# Patient Record
Sex: Female | Born: 1995 | Race: White | Hispanic: No | Marital: Single | Smoking: Never smoker
Health system: Southern US, Community
[De-identification: ages and names within clinical notes are randomized; demographics above are authoritative.]

## PROBLEM LIST (undated history)

## (undated) HISTORY — PX: WISDOM TOOTH EXTRACTION: SHX21

---

## 2019-10-22 ENCOUNTER — Encounter (HOSPITAL_COMMUNITY): Payer: Self-pay

## 2019-10-22 ENCOUNTER — Other Ambulatory Visit: Payer: Self-pay

## 2019-10-22 DIAGNOSIS — O9A211 Injury, poisoning and certain other consequences of external causes complicating pregnancy, first trimester: Secondary | ICD-10-CM | POA: Insufficient documentation

## 2019-10-22 DIAGNOSIS — Y929 Unspecified place or not applicable: Secondary | ICD-10-CM | POA: Insufficient documentation

## 2019-10-22 DIAGNOSIS — O210 Mild hyperemesis gravidarum: Secondary | ICD-10-CM | POA: Insufficient documentation

## 2019-10-22 DIAGNOSIS — Z3A01 Less than 8 weeks gestation of pregnancy: Secondary | ICD-10-CM | POA: Insufficient documentation

## 2019-10-22 DIAGNOSIS — S93401A Sprain of unspecified ligament of right ankle, initial encounter: Secondary | ICD-10-CM | POA: Insufficient documentation

## 2019-10-22 DIAGNOSIS — X58XXXA Exposure to other specified factors, initial encounter: Secondary | ICD-10-CM | POA: Insufficient documentation

## 2019-10-22 DIAGNOSIS — R55 Syncope and collapse: Secondary | ICD-10-CM | POA: Insufficient documentation

## 2019-10-22 DIAGNOSIS — Y999 Unspecified external cause status: Secondary | ICD-10-CM | POA: Insufficient documentation

## 2019-10-22 DIAGNOSIS — S0990XA Unspecified injury of head, initial encounter: Secondary | ICD-10-CM | POA: Insufficient documentation

## 2019-10-22 DIAGNOSIS — O26891 Other specified pregnancy related conditions, first trimester: Secondary | ICD-10-CM | POA: Insufficient documentation

## 2019-10-22 DIAGNOSIS — Y939 Activity, unspecified: Secondary | ICD-10-CM | POA: Insufficient documentation

## 2019-10-22 LAB — BASIC METABOLIC PANEL
Anion gap: 9 (ref 5–15)
BUN: 7 mg/dL (ref 6–20)
CO2: 23 mmol/L (ref 22–32)
Calcium: 9.6 mg/dL (ref 8.9–10.3)
Chloride: 105 mmol/L (ref 98–111)
Creatinine, Ser: 0.53 mg/dL (ref 0.44–1.00)
GFR calc Af Amer: >60 mL/min (ref >60)
GFR calc non Af Amer: >60 mL/min (ref >60)
Glucose, Bld: 105 mg/dL — ABNORMAL HIGH (ref 70–99)
Potassium: 3.8 mmol/L (ref 3.5–5.1)
Sodium: 137 mmol/L (ref 135–145)

## 2019-10-22 LAB — I-STAT BETA HCG BLOOD, ED (MC, WL, AP ONLY): I-stat hCG, quantitative: >2000 m[IU]/mL — ABNORMAL HIGH (ref <5)

## 2019-10-22 LAB — CBG MONITORING, ED: Glucose-Capillary: 103 mg/dL — ABNORMAL HIGH (ref 70–99)

## 2019-10-22 LAB — URINALYSIS, ROUTINE W REFLEX MICROSCOPIC
Bilirubin Urine: NEGATIVE
Glucose, UA: NEGATIVE mg/dL
Hgb urine dipstick: NEGATIVE
Ketones, ur: NEGATIVE mg/dL
Leukocytes,Ua: NEGATIVE
Nitrite: NEGATIVE
Protein, ur: NEGATIVE mg/dL
Specific Gravity, Urine: 1.003 — ABNORMAL LOW (ref 1.005–1.030)
pH: 7 (ref 5.0–8.0)

## 2019-10-22 LAB — CBC
HCT: 41.1 % (ref 36.0–46.0)
Hemoglobin: 13.5 g/dL (ref 12.0–15.0)
MCH: 28.5 pg (ref 26.0–34.0)
MCHC: 32.8 g/dL (ref 30.0–36.0)
MCV: 86.9 fL (ref 80.0–100.0)
Platelets: 355 10*3/uL (ref 150–400)
RBC: 4.73 MIL/uL (ref 3.87–5.11)
RDW: 14.3 % (ref 11.5–15.5)
WBC: 12.7 10*3/uL — ABNORMAL HIGH (ref 4.0–10.5)
nRBC: 0 % (ref 0.0–0.2)

## 2019-10-22 NOTE — ED Triage Notes (Signed)
Pt arrives POV from home with complaints of a syncope today. Pt endorses head trauma. Pt reports that she is [redacted] weeks pregnant. Pt denies any PMH.

## 2019-10-23 ENCOUNTER — Encounter (HOSPITAL_COMMUNITY): Payer: Self-pay | Admitting: Emergency Medicine

## 2019-10-23 ENCOUNTER — Emergency Department (HOSPITAL_COMMUNITY): Payer: Self-pay

## 2019-10-23 ENCOUNTER — Emergency Department (HOSPITAL_COMMUNITY)
Admission: EM | Admit: 2019-10-23 | Discharge: 2019-10-23 | Disposition: A | Discharge status: Home / Self Care | Payer: Self-pay | Attending: Emergency Medicine | Admitting: Emergency Medicine

## 2019-10-23 DIAGNOSIS — S93401A Sprain of unspecified ligament of right ankle, initial encounter: Secondary | ICD-10-CM

## 2019-10-23 DIAGNOSIS — O210 Mild hyperemesis gravidarum: Secondary | ICD-10-CM

## 2019-10-23 DIAGNOSIS — S0990XA Unspecified injury of head, initial encounter: Secondary | ICD-10-CM

## 2019-10-23 DIAGNOSIS — R55 Syncope and collapse: Secondary | ICD-10-CM

## 2019-10-23 MED ORDER — METOCLOPRAMIDE HCL 10 MG PO TABS
10.0000 mg | ORAL_TABLET | Freq: Four times a day (QID) | ORAL | 0 refills | Status: DC | PRN
Start: 2019-10-23 — End: 2020-03-21

## 2019-10-23 NOTE — ED Provider Notes (Addendum)
[facility] Provider Note: Monique Dell, MD, FACEP  CSN: [id number] MRN: [id number] ARRIVAL: 10/22/19 at 1722 ROOM: WTR9/WTR9   CHIEF COMPLAINT  Syncope   HISTORY OF PRESENT ILLNESS  10/23/19 3:32 AM Alaney Witter is a 24 y.o. female who is about [redacted] weeks pregnant.  She sprained her right ankle about 3 weeks ago.  She was wearing a friend's brace on it but no longer has that.  She has had about a week of fairly persistent nausea and vomiting which she attributes to morning sickness.  Yesterday afternoon about 4 PM while she was examining her ankle she had a syncopal episode.  She fell and hit the back of her head.  She has a mild headache in her occipital region which she rates as a 2 out of 10.  She denies injury elsewhere.  She is not short of breath.   History reviewed. No pertinent past medical history.  Past Surgical History:  Procedure Laterality Date  . WISDOM TOOTH EXTRACTION      No family history on file.  Social History   Tobacco Use  . Smoking status: Never Smoker  . Smokeless tobacco: Never Used  Vaping Use  . Vaping Use: Every day  Substance Use Topics  . Alcohol use: Yes    Comment: occ  . Drug use: Yes    Types: Marijuana    Prior to Admission medications   Medication Sig Start Date End Date Taking? Authorizing Provider  metoCLOPramide (REGLAN) 10 MG tablet Take 1 tablet (10 mg total) by mouth every 6 (six) hours as needed for nausea or vomiting. 10/23/19   Swain Acree, Jonny Ruiz, MD    Allergies Patient has no known allergies.   REVIEW OF SYSTEMS  Negative except as noted here or in the History of Present Illness.   PHYSICAL EXAMINATION  Initial Vital Signs Blood pressure (!) 135/91, pulse 82, temperature 98.3 F (36.8 C), temperature source Oral, resp. rate 15, height 5\' 2"  (1.575 m), weight 83.3 kg, SpO2 99 %.  Examination General: Well-developed, well-nourished female in no acute distress; appearance consistent with age of record HENT:  normocephalic; mild tenderness at base of skull (occiput) Eyes: pupils equal, round and reactive to light; extraocular muscles intact Neck: supple; nontender Heart: regular rate and rhythm Lungs: clear to auscultation bilaterally Abdomen: soft; nondistended; nontender; bowel sounds present Extremities: No deformity; pulses normal; ecchymosis and swelling over right lateral malleolus Neurologic: Awake, alert and oriented; motor function intact in all extremities and symmetric; no facial droop Skin: Warm and dry Psychiatric: Normal mood and affect   RESULTS  Summary of this visit's results, reviewed and interpreted by myself:   EKG Interpretation  Date/Time:  Sunday October 22 2019 22:06:03 EDT Ventricular Rate:  72 PR Interval:  128 QRS Duration: 94 QT Interval:  386 QTC Calculation: 422 R Axis:   87 Text Interpretation: Sinus rhythm with marked sinus arrhythmia Otherwise normal ECG No previous ECGs available Confirmed by Zedekiah Hinderman ([id number]) on 10/23/2019 3:29:07 AM      Laboratory Studies: Results for orders placed or performed during the hospital encounter of 10/23/19 (from the past 24 hour(s))  CBG monitoring, ED     Status: Abnormal   Collection Time: 10/22/19  5:42 PM  Result Value Ref Range   Glucose-Capillary 103 (H) 70 - 99 mg/dL  Basic metabolic panel     Status: Abnormal   Collection Time: 10/22/19  7:02 PM  Result Value Ref Range   Sodium 137 135 - 145  mmol/L   Potassium 3.8 3.5 - 5.1 mmol/L   Chloride 105 98 - 111 mmol/L   CO2 23 22 - 32 mmol/L   Glucose, Bld 105 (H) 70 - 99 mg/dL   BUN 7 6 - 20 mg/dL   Creatinine, Ser 2.09 0.44 - 1.00 mg/dL   Calcium 9.6 8.9 - 47.0 mg/dL   GFR calc non Af Amer >60 >60 mL/min   GFR calc Af Amer >60 >60 mL/min   Anion gap 9 5 - 15  CBC     Status: Abnormal   Collection Time: 10/22/19  7:02 PM  Result Value Ref Range   WBC 12.7 (H) 4.0 - 10.5 K/uL   RBC 4.73 3.87 - 5.11 MIL/uL   Hemoglobin 13.5 12.0 - 15.0 g/dL   HCT 96.2  36 - 46 %   MCV 86.9 80.0 - 100.0 fL   MCH 28.5 26.0 - 34.0 pg   MCHC 32.8 30.0 - 36.0 g/dL   RDW 83.6 62.9 - 47.6 %   Platelets 355 150 - 400 K/uL   nRBC 0.0 0.0 - 0.2 %  I-Stat beta hCG blood, ED     Status: Abnormal   Collection Time: 10/22/19  7:07 PM  Result Value Ref Range   I-stat hCG, quantitative >2,000.0 (H) <5 mIU/mL   Comment 3          Urinalysis, Routine w reflex microscopic Urine, Clean Catch     Status: Abnormal   Collection Time: 10/22/19  7:33 PM  Result Value Ref Range   Color, Urine STRAW (A) YELLOW   APPearance CLEAR CLEAR   Specific Gravity, Urine 1.003 (L) 1.005 - 1.030   pH 7.0 5.0 - 8.0   Glucose, UA NEGATIVE NEGATIVE mg/dL   Hgb urine dipstick NEGATIVE NEGATIVE   Bilirubin Urine NEGATIVE NEGATIVE   Ketones, ur NEGATIVE NEGATIVE mg/dL   Protein, ur NEGATIVE NEGATIVE mg/dL   Nitrite NEGATIVE NEGATIVE   Leukocytes,Ua NEGATIVE NEGATIVE   Imaging Studies: No results found.  ED COURSE and MDM  Nursing notes, initial and subsequent vitals signs, including pulse oximetry, reviewed and interpreted by myself.  Vitals:   10/22/19 1738 10/22/19 1739 10/22/19 2210 10/23/19 0318  BP: 135/88  134/84 (!) 135/91  Pulse: 83  63 82  Resp: 20  18 15   Temp: 98.4 F (36.9 C)   98.3 F (36.8 C)  TempSrc: Oral   Oral  SpO2: 99%  95% 99%  Weight:  83.3 kg    Height:  5\' 2"  (1.575 m)     Medications - No data to display  Patient does not appear clinically dehydrated.  The syncopal episode may have been related to her morning sickness.  The ankle sprain appears to be healing but we will place it in an ASO to help facilitate further healing.  We will provide a prescription for Reglan until she can follow-up with her OB/GYN regarding longer term treatment of her morning sickness.  PROCEDURES  Procedures   ED DIAGNOSES     ICD-10-CM   1. Syncope, unspecified syncope type  R55   2. Sprain of right ankle, unspecified ligament, initial encounter  S93.401A   3.  Morning sickness  O21.0   4. Minor head injury, initial encounter  S09.90XA        , MD 10/23/19 0341    Paula Libra, MD 10/23/19 [phone]

## 2020-03-09 ENCOUNTER — Ambulatory Visit (HOSPITAL_COMMUNITY)
Admission: EM | Admit: 2020-03-09 | Discharge: 2020-03-09 | Disposition: A | Discharge status: Home / Self Care | Payer: BC Managed Care – PPO | Attending: Family Medicine | Admitting: Family Medicine

## 2020-03-09 ENCOUNTER — Other Ambulatory Visit: Payer: Self-pay

## 2020-03-09 ENCOUNTER — Encounter (HOSPITAL_COMMUNITY): Payer: Self-pay | Admitting: Emergency Medicine

## 2020-03-09 ENCOUNTER — Telehealth (HOSPITAL_COMMUNITY): Payer: Self-pay | Admitting: Emergency Medicine

## 2020-03-09 DIAGNOSIS — L255 Unspecified contact dermatitis due to plants, except food: Secondary | ICD-10-CM

## 2020-03-09 MED ORDER — AMOXICILLIN-POT CLAVULANATE 875-125 MG PO TABS
1.0000 | ORAL_TABLET | Freq: Two times a day (BID) | ORAL | 0 refills | Status: DC
Start: 2020-03-09 — End: 2020-03-21

## 2020-03-09 MED ORDER — PREDNISONE 10 MG (48) PO TBPK
ORAL_TABLET | ORAL | 0 refills | Status: AC
Start: 2020-03-09 — End: ?

## 2020-03-09 MED ORDER — AMOXICILLIN-POT CLAVULANATE 875-125 MG PO TABS
1.0000 | ORAL_TABLET | Freq: Two times a day (BID) | ORAL | 0 refills | Status: DC
Start: 2020-03-09 — End: 2020-03-09

## 2020-03-09 MED ORDER — PREDNISONE 10 MG (48) PO TBPK
ORAL_TABLET | ORAL | 0 refills | Status: DC
Start: 2020-03-09 — End: 2020-03-09

## 2020-03-09 NOTE — ED Provider Notes (Signed)
  [facility]   [id number] 03/09/20 Arrival Time: 1024  ASSESSMENT & PLAN:  1. Rhus dermatitis   Likely infected.  Begin: Meds ordered this encounter  Medications  . amoxicillin-clavulanate (AUGMENTIN) 875-125 MG tablet    Sig: Take 1 tablet by mouth every 12 (twelve) hours.    Dispense:  14 tablet    Refill:  0  . predniSONE (STERAPRED UNI-PAK 48 TAB) 10 MG (48) TBPK tablet    Sig: Take as directed.    Dispense:  48 tablet    Refill:  0    Will follow up with PCP or here if worsening or failing to improve as anticipated. Reviewed expectations re: course of current medical issues. Questions answered. Outlined signs and symptoms indicating need for more acute intervention. Patient verbalized understanding. After Visit Summary given.   SUBJECTIVE:  Monique Vaughan is a 25 y.o. female who presents with a skin complaint. Itchy rash; bilateral arms with small spot on face; present this week; clear/yellow drainage from blistered areas.   Patient's last menstrual period was 03/02/2020.  OBJECTIVE: Vitals:   03/09/20 1148  BP: 122/70  Pulse: (!) 58  Resp: 18  Temp: (!) 97.5 F (36.4 C)  TempSrc: Oral  SpO2: 100%    General appearance: alert; no distress HEENT: [place]; AT Neck: supple with FROM Lungs: clear to auscultation bilaterally Heart: regular rate and rhythm Extremities: no edema; moves all extremities normally Skin: warm and dry; clusters of papules and vesicles with surrounding erythema over distal arms with small spot over L jaw; overlying yellowish crusting Psychological: alert and cooperative; normal mood and affect  No Known Allergies  History reviewed. No pertinent past medical history. Social History   Socioeconomic History  . Marital status: Single    Spouse name: Not on file  . Number of children: Not on file  . Years of education: Not on file  . Highest education level: Not on file  Occupational History  . Not on file  Tobacco Use  .  Smoking status: Never Smoker  . Smokeless tobacco: Never Used  Vaping Use  . Vaping Use: Every day  Substance and Sexual Activity  . Alcohol use: Yes    Comment: occ  . Drug use: Yes    Types: Marijuana  . Sexual activity: Yes    Birth control/protection: Pill  Other Topics Concern  . Not on file  Social History Narrative  . Not on file   Social Determinants of Health   Financial Resource Strain: Not on file  Food Insecurity: Not on file  Transportation Needs: Not on file  Physical Activity: Not on file  Stress: Not on file  Social Connections: Not on file  Intimate Partner Violence: Not on file   No family history on file. Past Surgical History:  Procedure Laterality Date  . WISDOM TOOTH EXTRACTION       Mardella Layman, MD 03/09/20 1236

## 2020-03-09 NOTE — ED Triage Notes (Signed)
Clusters of blisters on extremities and face.  Arms are particularly bad.  Areas itched more initially, than now.  Has been using otc creams.  Blisters/sores are oozing clear liquid

## 2020-03-21 ENCOUNTER — Other Ambulatory Visit: Payer: Self-pay

## 2020-03-21 ENCOUNTER — Ambulatory Visit (HOSPITAL_COMMUNITY)
Admission: EM | Admit: 2020-03-21 | Discharge: 2020-03-21 | Disposition: A | Discharge status: Home / Self Care | Payer: BC Managed Care – PPO | Attending: Family Medicine | Admitting: Family Medicine

## 2020-03-21 ENCOUNTER — Encounter (HOSPITAL_COMMUNITY): Payer: Self-pay

## 2020-03-21 DIAGNOSIS — T7840XA Allergy, unspecified, initial encounter: Secondary | ICD-10-CM

## 2020-03-21 DIAGNOSIS — Z880 Allergy status to penicillin: Secondary | ICD-10-CM

## 2020-03-21 MED ORDER — CETIRIZINE HCL 10 MG PO TABS: 10.0000 mg | ORAL_TABLET | Freq: Every day | ORAL | 0 refills | Status: AC

## 2020-03-21 NOTE — ED Provider Notes (Signed)
[facility]    CSN: [id number] Arrival date & time: 03/21/20  1058      History   Chief Complaint Chief Complaint  Patient presents with  . Allergic Reaction    HPI Monique Vaughan is a 25 y.o. female.   HPI  Patient states that she was seen several days ago for poison ivy. She was broken out all of her arms. There was 1 area that look infected. She was treated with prednisone and Augmentin. She took all of the Augmentin and then broke out in a rash that is "all over". Itches mildly. She has never been allergic to medicines in the past. She has taken penicillin/amoxicillin in the past without trouble. Not for many years History reviewed. No pertinent past medical history.  There are no problems to display for this patient.   Past Surgical History:  Procedure Laterality Date  . WISDOM TOOTH EXTRACTION      OB History    Gravida  1   Para      Term      Preterm      AB      Living        SAB      IAB      Ectopic      Multiple      Live Births               Home Medications    Prior to Admission medications   Medication Sig Start Date End Date Taking? Authorizing Provider  cetirizine (ZYRTEC) 10 MG tablet Take 1 tablet (10 mg total) by mouth daily. 03/21/20  Yes Eustace Moore, MD  norgestimate-ethinyl estradiol (ORTHO-CYCLEN) 0.25-35 MG-MCG tablet Take 1 tablet by mouth daily.    [provider]  predniSONE (STERAPRED UNI-PAK 48 TAB) 10 MG (48) TBPK tablet Take as directed. 03/09/20   Mardella Layman, MD  metoCLOPramide (REGLAN) 10 MG tablet Take 1 tablet (10 mg total) by mouth every 6 (six) hours as needed for nausea or vomiting. 10/23/19 03/21/20  Molpus, Jonny Ruiz, MD    Family History History reviewed. No pertinent family history.  Social History Social History   Tobacco Use  . Smoking status: Never Smoker  . Smokeless tobacco: Never Used  Vaping Use  . Vaping Use: Every day  Substance Use Topics  . Alcohol use: Yes     Comment: occ  . Drug use: Yes    Types: Marijuana     Allergies   Augmentin [amoxicillin-pot clavulanate]   Review of Systems Review of Systems See HPI  Physical Exam Triage Vital Signs ED Triage Vitals  Enc Vitals Group     BP 03/21/20 1145 125/67     Pulse Rate 03/21/20 1145 79     Resp 03/21/20 1145 18     Temp 03/21/20 1145 98.5 F (36.9 C)     Temp Source 03/21/20 1145 Oral     SpO2 03/21/20 1145 100 %     Weight --      Height --      Head Circumference --      Peak Flow --      Pain Score 03/21/20 1143 0     Pain Loc --      Pain Edu? --      Excl. in GC? --    No data found.  Updated Vital Signs BP 125/67 (BP Location: Right Arm)   Pulse 79   Temp 98.5 F (36.9 C) (Oral)  Resp 18   LMP 03/02/2020   SpO2 100%   Breastfeeding Unknown     Physical Exam Constitutional:      General: She is not in acute distress.    Appearance: She is well-developed and well-nourished.  HENT:     Head: Normocephalic and atraumatic.     Mouth/Throat:     Mouth: Oropharynx is clear and moist.     Comments: Mask is in place Eyes:     Conjunctiva/sclera: Conjunctivae normal.     Pupils: Pupils are equal, round, and reactive to light.  Cardiovascular:     Rate and Rhythm: Normal rate.  Pulmonary:     Effort: Pulmonary effort is normal. No respiratory distress.     Breath sounds: Normal breath sounds.  Abdominal:     General: There is no distension.     Palpations: Abdomen is soft.  Musculoskeletal:        General: No edema. Normal range of motion.     Cervical back: Normal range of motion and neck supple.  Lymphadenopathy:     Cervical: No cervical adenopathy.  Skin:    General: Skin is warm and dry.     Findings: Rash present.     Comments: Fine morbilliform rash is seen on chest back and both upper extremities diffusely  Neurological:     Mental Status: She is alert.  Psychiatric:        Behavior: Behavior normal.      UC Treatments / Results   Labs (all labs ordered are listed, but only abnormal results are displayed) Labs Reviewed - No data to display  EKG   Radiology No results found.  Procedures Procedures (including critical care time)  Medications Ordered in UC Medications - No data to display  Initial Impression / Assessment and Plan / UC Course  I have reviewed the triage vital signs and the nursing notes.  Pertinent labs & imaging results that were available during my care of the patient were reviewed by me and considered in my medical decision making (see chart for details).     Encourage patient to finish her prednisone. I believe this is a penicillin rash. Cautioned against taking penicillins in the future unless it is an emergency. We will treat with antihistamines since she is already on prednisone  Follow-up as needed Final Clinical Impressions(s) / UC Diagnoses   Final diagnoses:  Penicillin allergy  Allergic reaction to drug, initial encounter     Discharge Instructions     Finish the prednisone Take zyrtec once a day Return as needed   ED Prescriptions    Medication Sig Dispense Auth. Provider   cetirizine (ZYRTEC) 10 MG tablet Take 1 tablet (10 mg total) by mouth daily. 30 tablet Eustace Moore, MD     PDMP not reviewed this encounter.   Eustace Moore, MD 03/21/20 1228

## 2020-03-21 NOTE — ED Triage Notes (Signed)
Pt presents with rash on different areas of body since yesterday that she states is slightly itchy.

## 2020-03-21 NOTE — Discharge Instructions (Addendum)
Finish the prednisone Take zyrtec once a day Return as needed

## 2020-06-26 ENCOUNTER — Other Ambulatory Visit: Payer: Self-pay

## 2020-06-26 ENCOUNTER — Encounter (HOSPITAL_COMMUNITY): Payer: Self-pay

## 2020-06-26 ENCOUNTER — Ambulatory Visit (INDEPENDENT_AMBULATORY_CARE_PROVIDER_SITE_OTHER): Payer: BC Managed Care – PPO

## 2020-06-26 ENCOUNTER — Ambulatory Visit (HOSPITAL_COMMUNITY)
Admission: RE | Admit: 2020-06-26 | Discharge: 2020-06-26 | Disposition: A | Discharge status: Home / Self Care | Payer: BC Managed Care – PPO | Source: Ambulatory Visit | Attending: Urgent Care | Admitting: Urgent Care

## 2020-06-26 VITALS — BP 119/62 | HR 80 | Temp 98.4°F | Resp 18

## 2020-06-26 DIAGNOSIS — R2241 Localized swelling, mass and lump, right lower limb: Secondary | ICD-10-CM | POA: Diagnosis not present

## 2020-06-26 DIAGNOSIS — M79671 Pain in right foot: Secondary | ICD-10-CM

## 2020-06-26 MED ORDER — NAPROXEN 500 MG PO TABS
500.0000 mg | ORAL_TABLET | Freq: Two times a day (BID) | ORAL | 0 refills | Status: AC
Start: 2020-06-26 — End: ?

## 2020-06-26 NOTE — ED Triage Notes (Signed)
Pt c/o right foot pain and swelling. Pt states she has not been able to put weight on her foot.

## 2020-06-26 NOTE — ED Provider Notes (Signed)
[facility]   MRN: [id number] DOB: 04-Jul-1995  Subjective:   Monique Vaughan is a 25 y.o. female presenting for 1 day history of acute onset of right foot pain with swelling.  Patient reports that she feels a knot on the underside of her foot but pops and clicks.  Yesterday she had a difficult time bearing weight on it.  Today reports that it has been better.  Denies falls, trauma, redness, warmth, open wound.  No current facility-administered medications for this encounter.  Current Outpatient Medications:  .  cetirizine (ZYRTEC) 10 MG tablet, Take 1 tablet (10 mg total) by mouth daily., Disp: 30 tablet, Rfl: 0 .  norgestimate-ethinyl estradiol (ORTHO-CYCLEN) 0.25-35 MG-MCG tablet, Take 1 tablet by mouth daily., Disp: , Rfl:  .  predniSONE (STERAPRED UNI-PAK 48 TAB) 10 MG (48) TBPK tablet, Take as directed., Disp: 48 tablet, Rfl: 0   Allergies  Allergen Reactions  . Augmentin [Amoxicillin-Pot Clavulanate] Rash    Classic penicillin rash    History reviewed. No pertinent past medical history.   Past Surgical History:  Procedure Laterality Date  . WISDOM TOOTH EXTRACTION      History reviewed. No pertinent family history.  Social History   Tobacco Use  . Smoking status: Never Smoker  . Smokeless tobacco: Never Used  Vaping Use  . Vaping Use: Every day  Substance Use Topics  . Alcohol use: Yes    Comment: occ  . Drug use: Yes    Types: Marijuana    ROS   Objective:   Vitals: BP 119/62 (BP Location: Right Arm)   Pulse 80   Temp 98.4 F (36.9 C) (Oral)   Resp 18   LMP 06/24/2020 (Exact Date)   SpO2 98%   Physical Exam Constitutional:      General: She is not in acute distress.    Appearance: Normal appearance. She is well-developed. She is not ill-appearing, toxic-appearing or diaphoretic.  HENT:     Head: Normocephalic and atraumatic.     Nose: Nose normal.     Mouth/Throat:     Mouth: Mucous membranes are moist.     Pharynx:  Oropharynx is clear.  Eyes:     General: No scleral icterus.       Right eye: No discharge.        Left eye: No discharge.     Extraocular Movements: Extraocular movements intact.     Conjunctiva/sclera: Conjunctivae normal.     Pupils: Pupils are equal, round, and reactive to light.  Cardiovascular:     Rate and Rhythm: Normal rate.  Pulmonary:     Effort: Pulmonary effort is normal.  Musculoskeletal:       Feet:  Skin:    General: Skin is warm and dry.  Neurological:     General: No focal deficit present.     Mental Status: She is alert and oriented to person, place, and time.     Motor: No weakness.     Coordination: Coordination normal.     Gait: Gait normal.     Deep Tendon Reflexes: Reflexes normal.  Psychiatric:        Mood and Affect: Mood normal.        Behavior: Behavior normal.        Thought Content: Thought content normal.        Judgment: Judgment normal.    DG Foot Complete Right  Result Date: 06/26/2020 CLINICAL DATA:  Foot pain and swelling no injury EXAM:  RIGHT FOOT COMPLETE - 3+ VIEW COMPARISON:  None. FINDINGS: There is no evidence of fracture or dislocation. There is no evidence of arthropathy or other focal bone abnormality. Soft tissues are unremarkable. IMPRESSION: Negative. Electronically Signed   By: Marlan Palau M.D.   On: 06/26/2020 12:01   Assessment and Plan :   PDMP not reviewed this encounter.  1. Foot pain, right     Suspect inflammatory process related to the nature of her ADLs including extensive time standing or walking.  Recommended general RICE method, naproxen for pain and inflammation. Counseled patient on potential for adverse effects with medications prescribed/recommended today, ER and return-to-clinic precautions discussed, patient verbalized understanding.    Wallis Bamberg, PA-C 06/26/20 1300

## 2021-09-05 IMAGING — DX DG FOOT COMPLETE 3+V*R*
3 series · 3 of 3 positions shown · non-contrast
Comparison: None.

CLINICAL DATA: Foot pain and swelling no injury

EXAM:
RIGHT FOOT COMPLETE - 3+ VIEW

[foot ap]
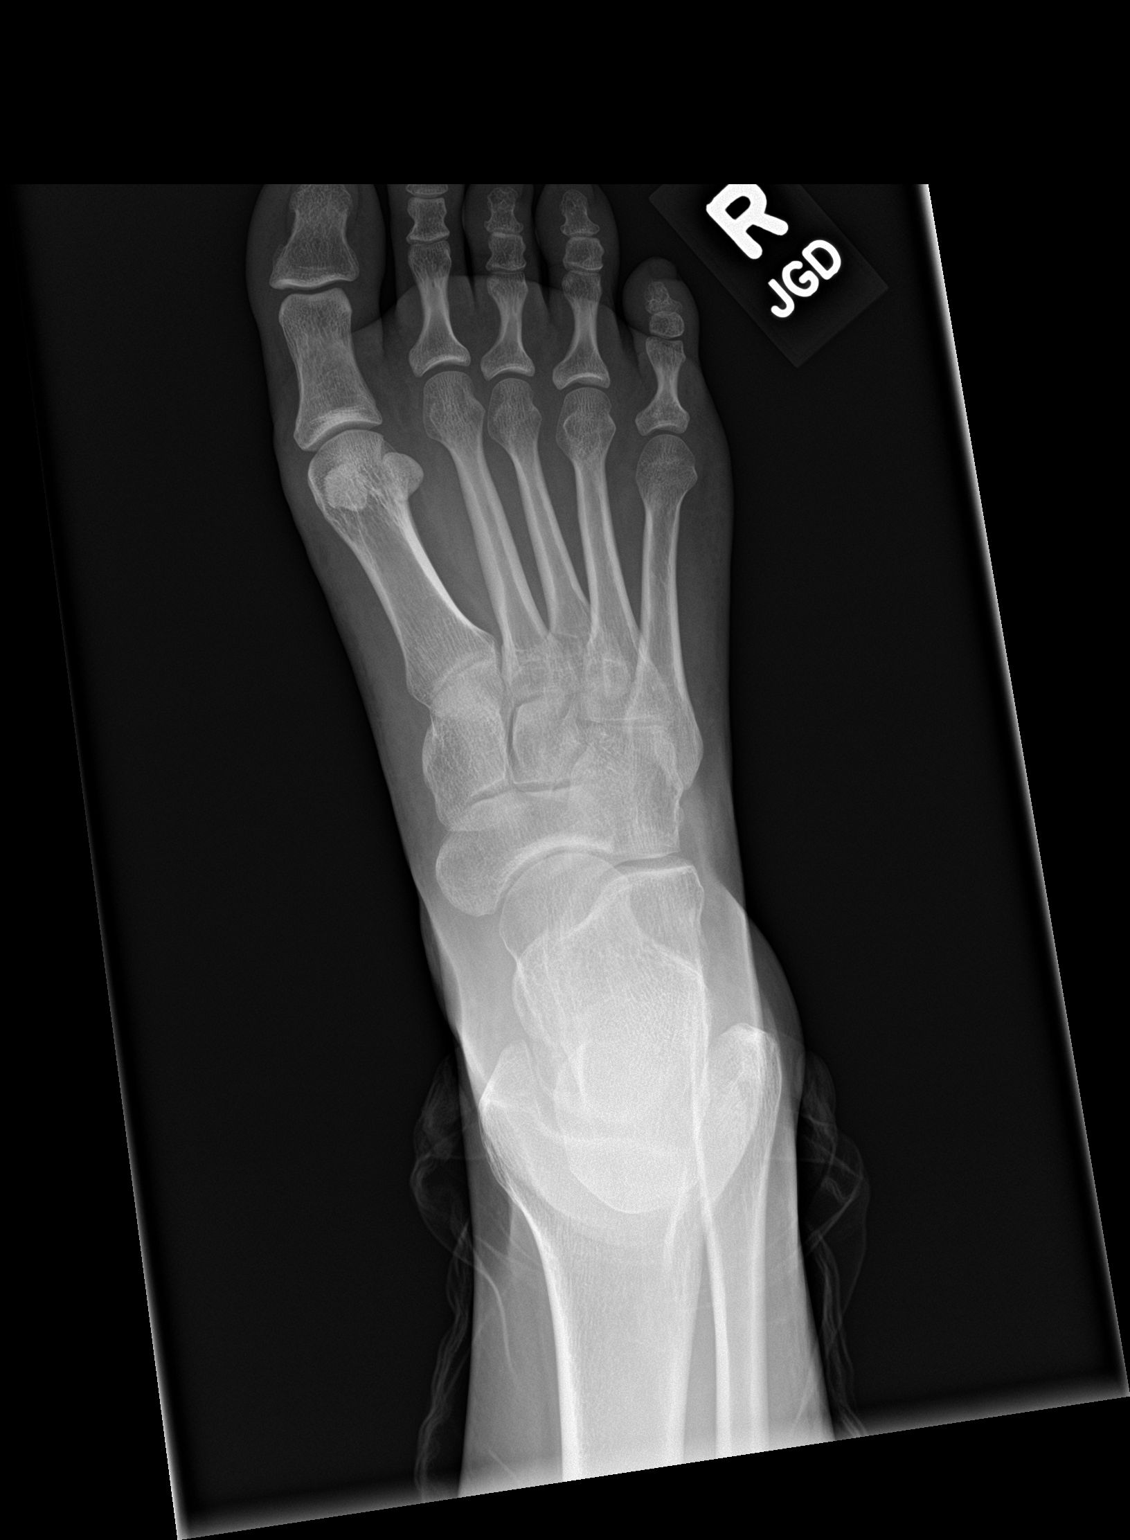

[foot obl]
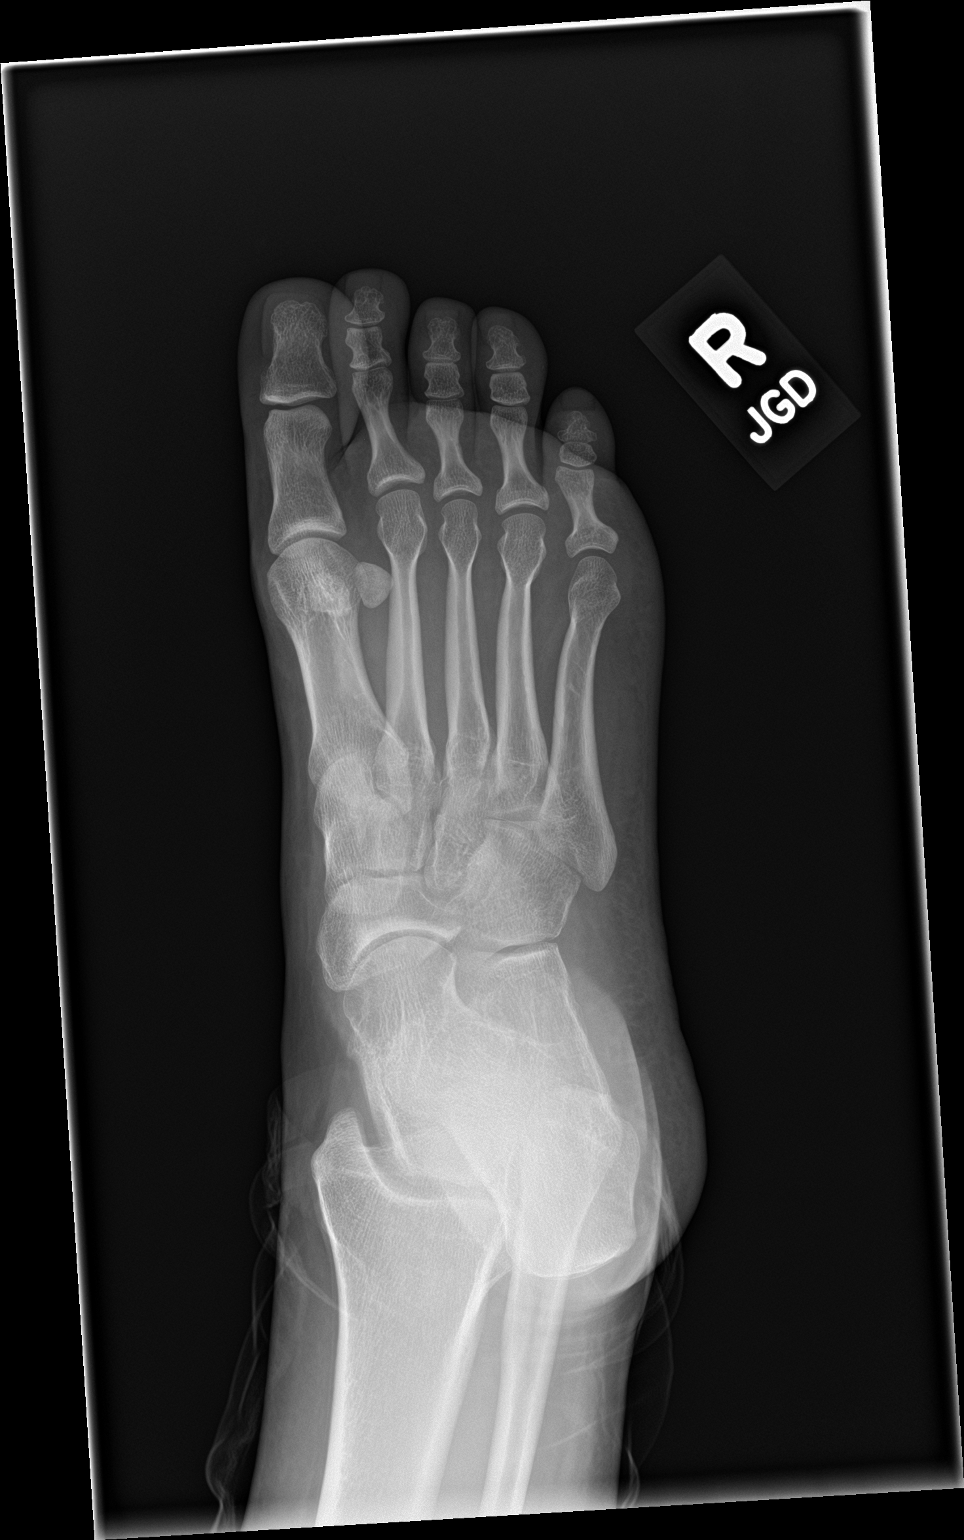

[foot lat]
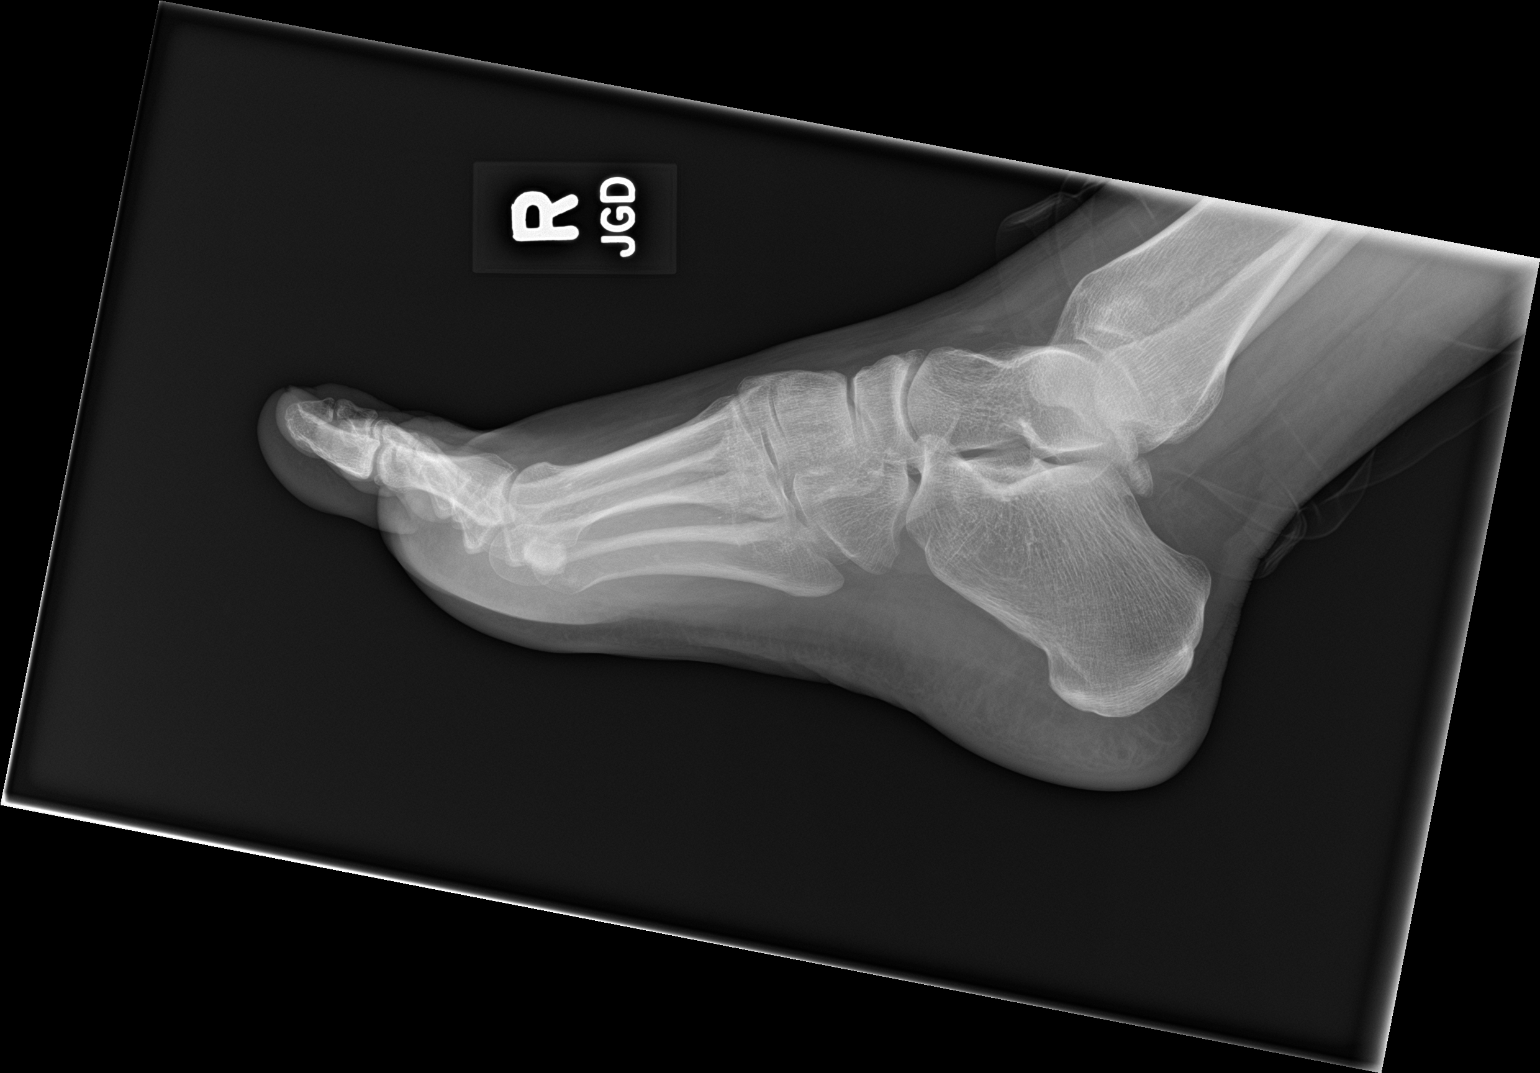

[3 of 3 positions shown; findings below may reference images not displayed]

FINDINGS: There is no evidence of fracture or dislocation. There is no
evidence of arthropathy or other focal bone abnormality. Soft
tissues are unremarkable.
IMPRESSION: Negative.
